# Patient Record
Sex: Male | Born: 2000 | Race: White | Hispanic: No | Marital: Single | Smoking: Never smoker
Health system: Southern US, Community
[De-identification: ages and names within clinical notes are randomized; demographics above are authoritative.]

---
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
## 2024-05-21 DIAGNOSIS — Z419 Encounter for procedure for purposes other than remedying health state, unspecified: Secondary | ICD-10-CM | POA: Diagnosis not present
